# Patient Record
Sex: Female | Born: 1945 | Race: Black or African American | Hispanic: No | Marital: Single | Smoking: Never smoker
Health system: Southern US, Community
[De-identification: ages and names within clinical notes are randomized; demographics above are authoritative.]

## PROBLEM LIST (undated history)

## (undated) HISTORY — PX: SHOULDER SURGERY: SHX246

## (undated) HISTORY — PX: APPENDECTOMY: SHX54

---

## 2015-02-22 ENCOUNTER — Other Ambulatory Visit (HOSPITAL_COMMUNITY): Payer: Self-pay | Admitting: Orthopaedic Surgery

## 2015-02-22 DIAGNOSIS — R52 Pain, unspecified: Secondary | ICD-10-CM

## 2015-02-23 ENCOUNTER — Ambulatory Visit (HOSPITAL_COMMUNITY)
Admission: RE | Admit: 2015-02-23 | Discharge: 2015-02-23 | Disposition: A | Discharge status: Home / Self Care | Payer: Medicare Other | Source: Ambulatory Visit | Attending: Orthopaedic Surgery | Admitting: Orthopaedic Surgery

## 2015-02-23 DIAGNOSIS — M25511 Pain in right shoulder: Secondary | ICD-10-CM | POA: Insufficient documentation

## 2015-02-23 DIAGNOSIS — R52 Pain, unspecified: Secondary | ICD-10-CM

## 2016-04-29 IMAGING — MR MR SHOULDER*R* W/O CM
4 of 5 series · 19 of 40 positions shown · non-contrast
Comparison: None.

CLINICAL DATA: Right shoulder pain for 2 weeks.  No known injury.

EXAM:
MRI OF THE RIGHT SHOULDER WITHOUT CONTRAST
TECHNIQUE: Multiplanar, multisequence MR imaging of the shoulder was performed.
No intravenous contrast was administered.

[Series 3: T2 fat-sat · axial · 4.0mm · 0.23mm/px · z∈[-43,+40]mm · 6 of 22 slices shown (1 of 3)]
[im 1/22]
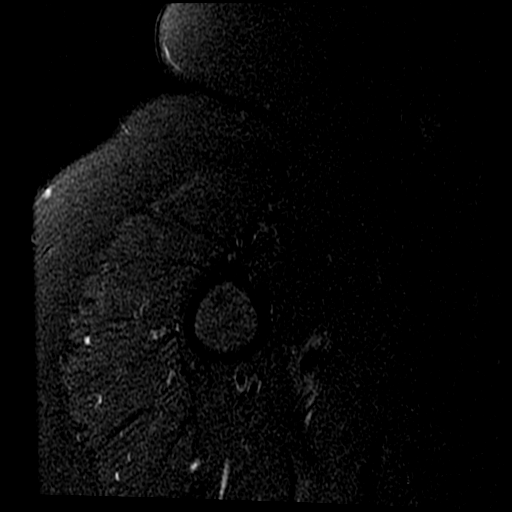
[im 4/22]
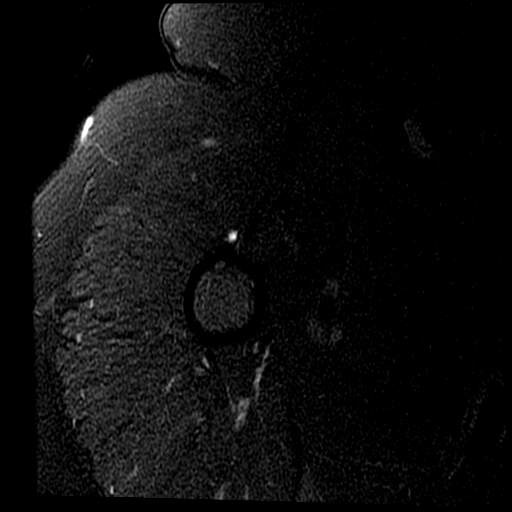
[im 7/22]
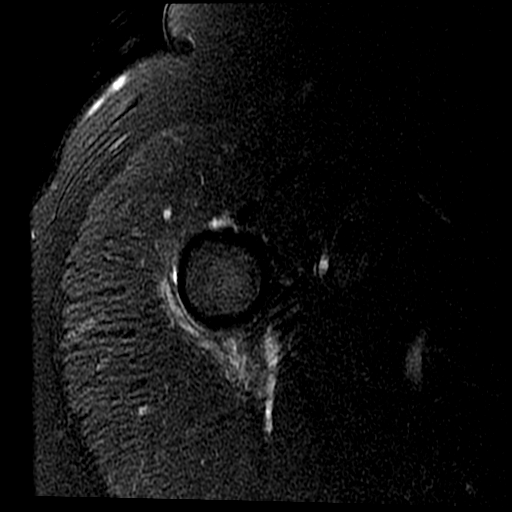
[im 10/22]
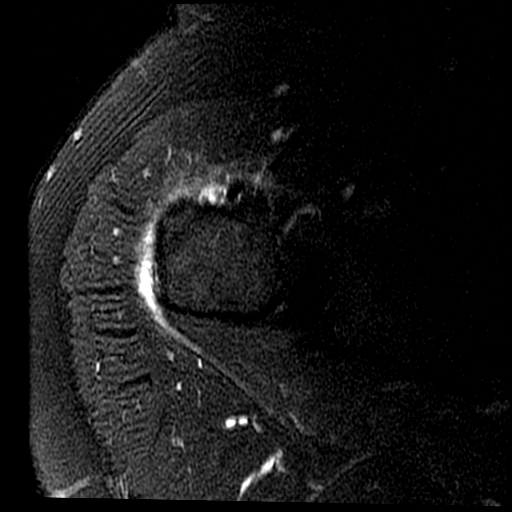
[im 13/22]
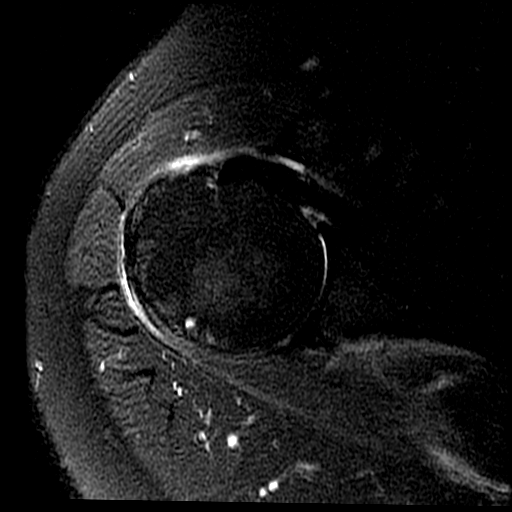
[im 19/22]
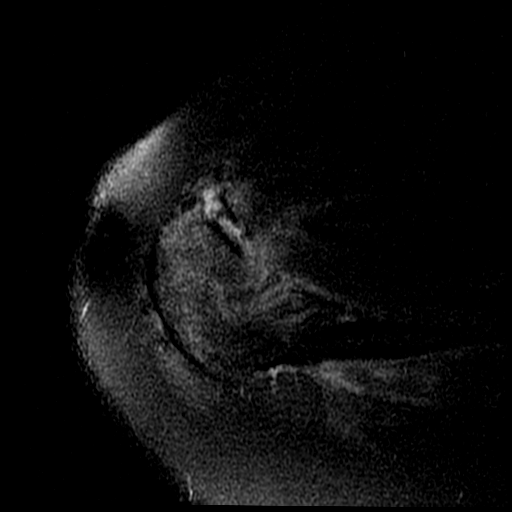

[Series 4: T2 fat-sat · oblique · 4.0mm · 0.29mm/px · 3 of 19 slices shown (2 of 3)]
[im 4/19]
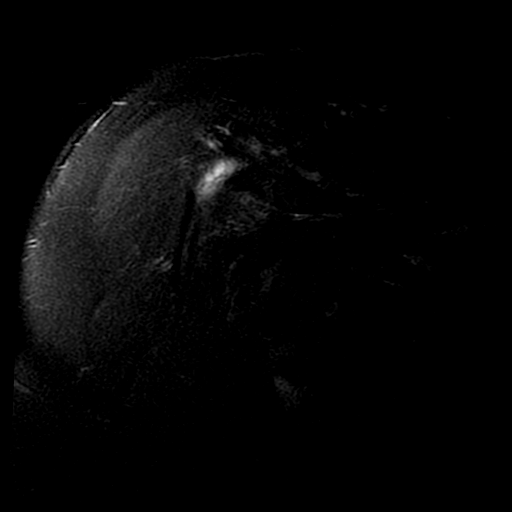
[im 10/19]
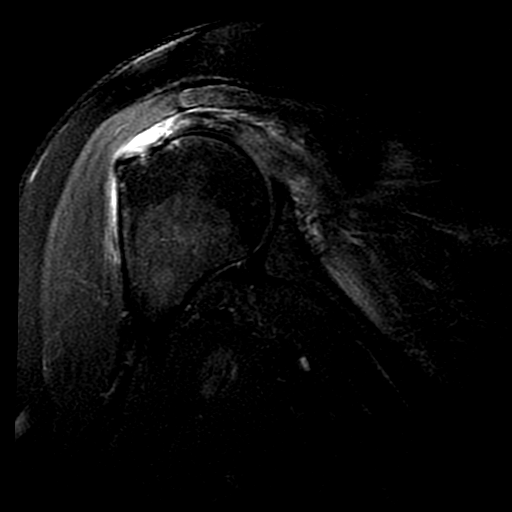
[im 16/19]
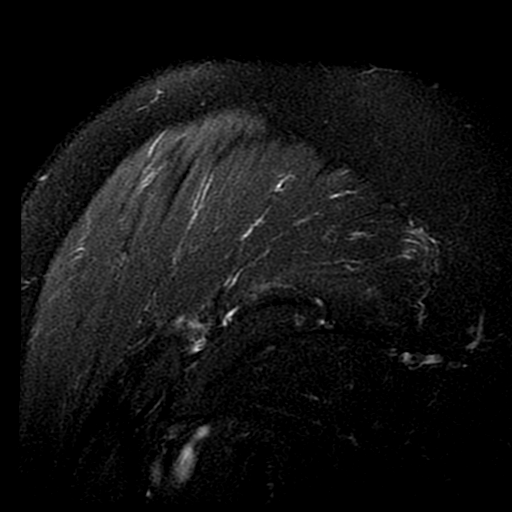

[Series 5: PD · oblique · 4.0mm · 0.29mm/px · 7 of 19 slices shown]
[im 1/19]
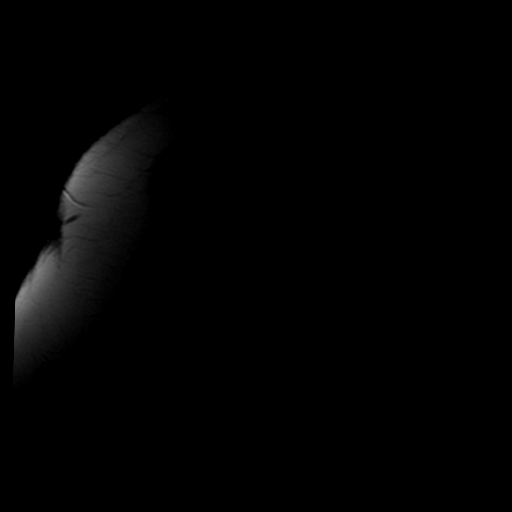
[im 4/19]
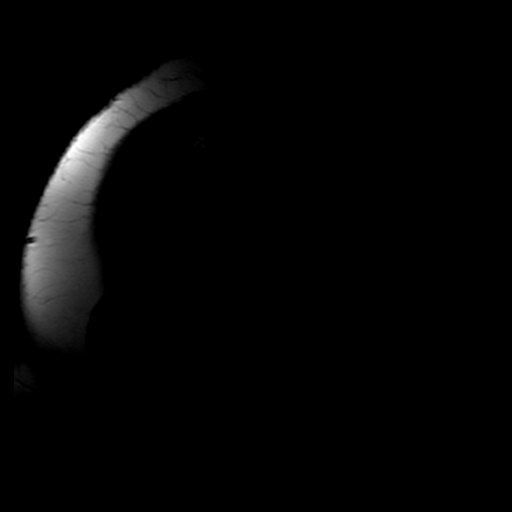
[im 7/19]
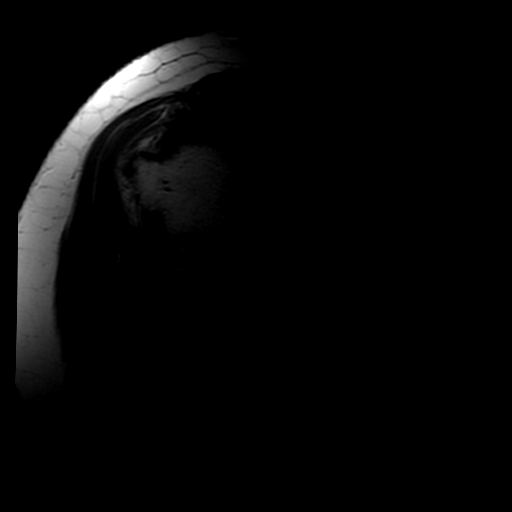
[im 10/19]
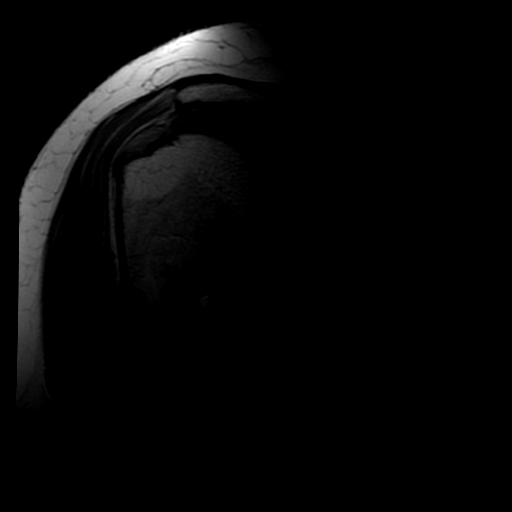
[im 13/19]
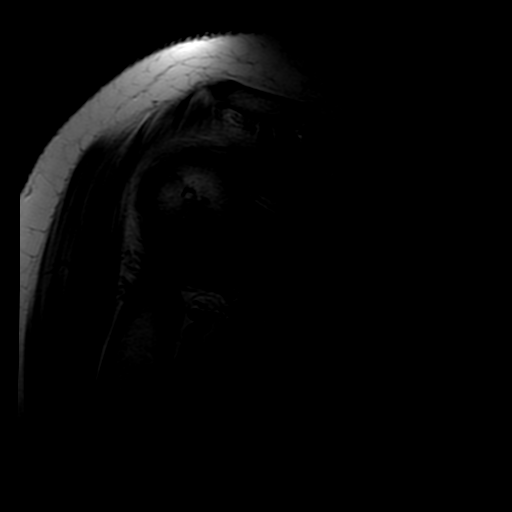
[im 16/19]
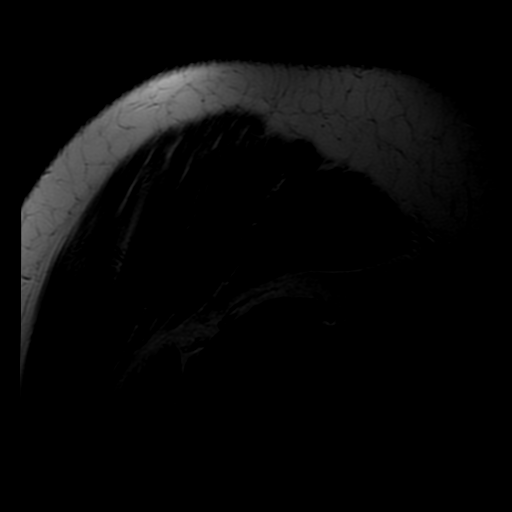
[im 19/19]
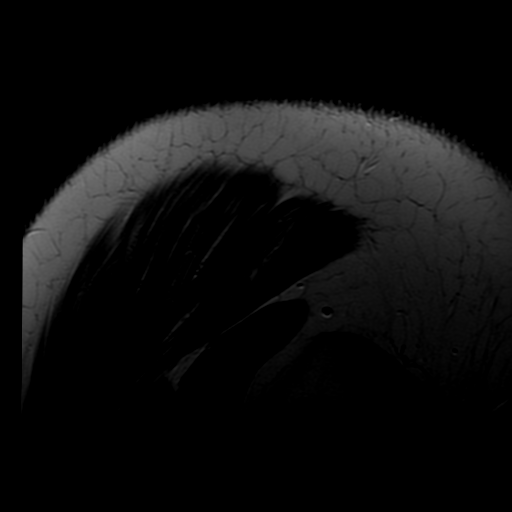

[Series 7: T2 fat-sat · oblique · 4.0mm · 0.29mm/px · 3 of 23 slices shown (3 of 3)]
[im 3/23]
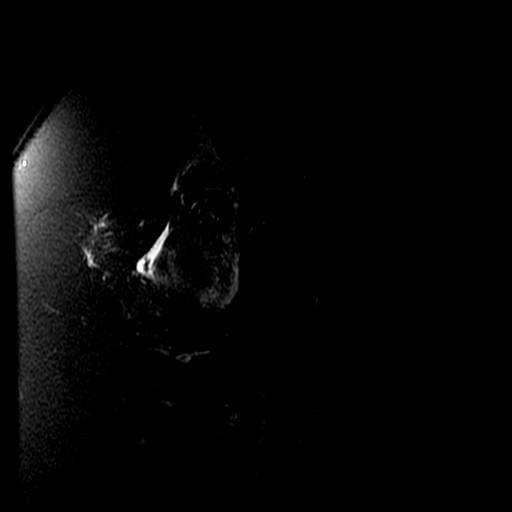
[im 12/23]
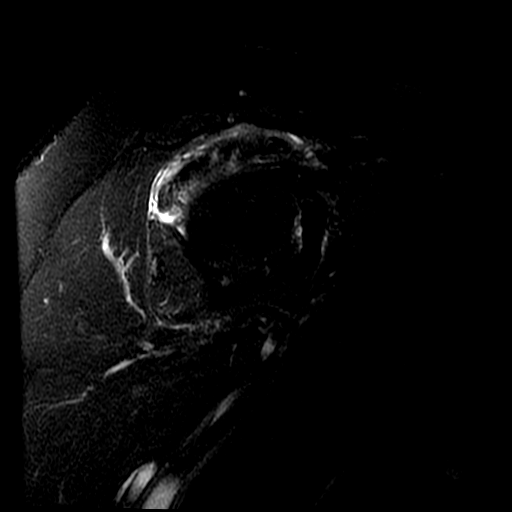
[im 20/23]
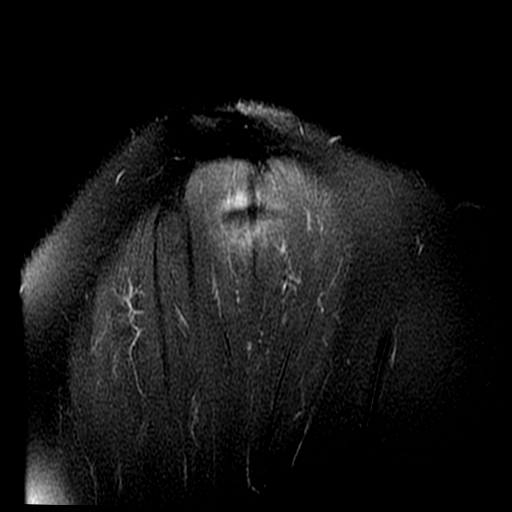

[19 of 40 positions shown; findings below may reference images not displayed]

FINDINGS: Rotator cuff: The patient has a near complete supraspinatus tendon
tear. Only a thin band of undersurface fibers may be intact.
Retraction is to the top of the humeral head, 2-3 cm. The
infraspinatus tendon appears completely torn and retracted 2-3 cm.
The subscapularis is intact.

Muscles: Mild infraspinatus atrophy is identified and there is edema
within the infraspinatus. Imaged musculature otherwise appears
normal.

Biceps long head:  Intact.

Acromioclavicular Joint:  Moderate degenerative change is seen.

Glenohumeral Joint: Unremarkable.

Labrum:  Intact.

Bones: The acromion is type 1. No fracture or worrisome marrow
lesion is identified. Fluid is present in the subacromial/subdeltoid
bursa.
IMPRESSION: Near complete supraspinatus tendon tear with 2-3 cm of retraction
and no atrophy.

The infraspinatus appears completely torn and retracted 2-3 cm. The
tear may be acute on chronic with both mild atrophy and edema seen
within the infraspinatus.

Moderate acromioclavicular degenerative change.

Subacromial/subdeltoid fluid consistent with bursitis.

## 2022-03-17 ENCOUNTER — Encounter: Payer: Self-pay | Admitting: Emergency Medicine

## 2022-03-17 ENCOUNTER — Emergency Department (INDEPENDENT_AMBULATORY_CARE_PROVIDER_SITE_OTHER): Payer: Medicare Other

## 2022-03-17 ENCOUNTER — Emergency Department
Admission: EM | Admit: 2022-03-17 | Discharge: 2022-03-17 | Disposition: A | Discharge status: Home / Self Care | Payer: Medicare Other | Source: Home / Self Care | Attending: Family Medicine | Admitting: Family Medicine

## 2022-03-17 DIAGNOSIS — S63511A Sprain of carpal joint of right wrist, initial encounter: Secondary | ICD-10-CM | POA: Diagnosis not present

## 2022-03-17 DIAGNOSIS — S01511A Laceration without foreign body of lip, initial encounter: Secondary | ICD-10-CM

## 2022-03-17 DIAGNOSIS — W010XXA Fall on same level from slipping, tripping and stumbling without subsequent striking against object, initial encounter: Secondary | ICD-10-CM

## 2022-03-17 DIAGNOSIS — M25531 Pain in right wrist: Secondary | ICD-10-CM

## 2022-03-17 MED ORDER — ACETAMINOPHEN 325 MG PO TABS
650.0000 mg | ORAL_TABLET | Freq: Once | ORAL | Status: AC
  Administered 2022-03-17: 650 mg via ORAL

## 2022-03-17 NOTE — ED Provider Notes (Signed)
[facility]    CSN: [id number] Arrival date & time: 03/17/22  1036      History   Chief Complaint Chief Complaint  Patient presents with   Fall    HPI Stephanie Bass is a 76 y.o. female.   Patient reports that she tripped on uneven pavement at 8:45am today while at a bus stop.  She fell forward, bracing herself with her hands, and striking her lip on pavement.  She experienced a laceration of her lower lip, minimal abrasion to her left temporal area, and mild swelling/pain in her right wrist. She bumped her left anterior knee but denies pain there.  She denies loss of consciousness, headache, dizziness, neck pain, etc.   The history is provided by the patient.   History reviewed. No pertinent past medical history.  There are no problems to display for this patient.   Past Surgical History:  Procedure Laterality Date   APPENDECTOMY     CESAREAN SECTION     SHOULDER SURGERY      OB History   No obstetric history on file.      Home Medications    Prior to Admission medications   Medication Sig Start Date End Date Taking? Authorizing Provider  amLODipine (NORVASC) 5 MG tablet amlodipine 5 mg tablet  TAKE 1 TABLET BY MOUTH DAILY 11/26/18  Yes [provider]  azelastine (ASTELIN) 0.1 % nasal spray ONE SPRAY BY NASAL ROUTE DAILY. USE IN EACH NOSTRIL AS DIRECTED 11/01/16  Yes [provider]  cetirizine (ZYRTEC) 10 MG tablet cetirizine 10 mg tablet  TAKE 1 TABLET BY MOUTH EVERY DAY 04/23/18  Yes [provider]  dorzolamide-timolol (COSOPT) 22.3-6.8 MG/ML ophthalmic solution dorzolamide 22.3 mg-timolol 6.8 mg/mL eye drops  INSTILL 1 DROP INTO BOTH EYES TWICE A DAY 11/24/19  Yes [provider]  ferrous sulfate 325 (65 FE) MG tablet FeroSul 325 mg (65 mg iron) tablet  TAKE 1 TABLET BY MOUTH TWICE DAILY 03/08/22  Yes [provider]  latanoprost (XALATAN) 0.005 % ophthalmic solution latanoprost 0.005 % eye drops  INSTILL 1  DROP INTO BOTH EYES AT BEDTIME 01/09/17  Yes [provider]  metoprolol tartrate (LOPRESSOR) 50 MG tablet metoprolol tartrate 50 mg tablet  TAKE 1 TABLET BY MOUTH TWICE A DAY 02/07/18  Yes [provider]  simvastatin (ZOCOR) 5 MG tablet every evening. 03/22/21  Yes [provider]  ezetimibe (ZETIA) 10 MG tablet Take 10 mg by mouth daily. 11/14/21   [provider]  lisinopril-hydrochlorothiazide (ZESTORETIC) 20-12.5 MG tablet Take 1 tablet by mouth 2 (two) times daily. 03/15/22   [provider]  Omega-3 Fatty Acids (FISH OIL) 1200 MG CAPS Take by mouth.    [provider]    Family History Family History  Problem Relation Age of Onset   Diabetes Father     Social History Social History   Tobacco Use   Smoking status: Never    Passive exposure: Never   Smokeless tobacco: Never  Vaping Use   Vaping Use: Never used  Substance Use Topics   Alcohol use: Not Currently   Drug use: Never     Allergies   Patient has no known allergies.   Review of Systems Review of Systems  Constitutional: Negative.   HENT:  Positive for facial swelling. Negative for trouble swallowing.        Laceration of lower lip  Eyes: Negative.   Respiratory: Negative.    Cardiovascular: Negative.   Gastrointestinal:  Negative.   Genitourinary: Negative.   Musculoskeletal:  Negative for neck pain.       Right wrist pain  Neurological:  Negative for dizziness, syncope and light-headedness.    Physical Exam Triage Vital Signs ED Triage Vitals  Enc Vitals Group     BP 03/17/22 1105 116/72     Pulse Rate 03/17/22 1105 70     Resp 03/17/22 1105 18     Temp 03/17/22 1105 97.8 F (36.6 C)     Temp Source 03/17/22 1105 Tympanic     SpO2 03/17/22 1105 99 %     Weight 03/17/22 1108 235 lb (106.6 kg)     Height 03/17/22 1108 5\' 3"  (1.6 m)     Head Circumference --      Peak Flow --      Pain Score 03/17/22 1107 5     Pain Loc --      Pain Edu? --       Excl. in GC? --    No data found.  Updated Vital Signs BP 116/72 (BP Location: Left Arm)   Pulse 70   Temp 97.8 F (36.6 C) (Tympanic) Comment: pt had ice on mouth on arrival  Resp 18   Ht 5\' 3"  (1.6 m)   Wt 106.6 kg   SpO2 99%   BMI 41.63 kg/m   Visual Acuity Right Eye Distance:   Left Eye Distance:   Bilateral Distance:    Right Eye Near:   Left Eye Near:    Bilateral Near:     Physical Exam Vitals and nursing note reviewed.  Constitutional:      General: She is not in acute distress.    Appearance: She is obese. She is not toxic-appearing.  HENT:     Head:      Comments: Left temporal area has a tiny 69mm abrasion.  No swelling present and minimal tenderness.    Right Ear: External ear normal.     Left Ear: External ear normal.     Nose: Nose normal.     Mouth/Throat:     Mouth: Mucous membranes are moist. No lacerations or oral lesions.     Dentition: Normal dentition. No dental tenderness, gingival swelling or gum lesions.     Tongue: No lesions.     Pharynx: Uvula midline.      Comments: Mid-lower lip has shallow Y-shaped one cm laceration on mucosal surface just inside vermilion border. Eyes:     Extraocular Movements: Extraocular movements intact.     Conjunctiva/sclera: Conjunctivae normal.     Pupils: Pupils are equal, round, and reactive to light.  Cardiovascular:     Rate and Rhythm: Normal rate and regular rhythm.     Heart sounds: Normal heart sounds.  Pulmonary:     Effort: Pulmonary effort is normal.     Breath sounds: Normal breath sounds.  Musculoskeletal:        General: Tenderness present.     Right wrist: Swelling and bony tenderness present. No deformity, lacerations, snuff box tenderness or crepitus. Decreased range of motion. Normal pulse.       Hands:     Cervical back: Normal range of motion. No tenderness.     Comments: Right wrist has point tenderness to palpation dorsally as noted on diagram.  Distal neurovascular function  is intact.     Skin:    General: Skin is warm and dry.  Neurological:     General: No focal deficit present.  Mental Status: She is alert and oriented to person, place, and time.     UC Treatments / Results  Labs (all labs ordered are listed, but only abnormal results are displayed) Labs Reviewed - No data to display  EKG   Radiology DG Wrist Complete Right  Result Date: 03/17/2022 CLINICAL DATA:  Fall.  Tenderness over the dorsal wrist. EXAM: RIGHT WRIST - COMPLETE 3+ VIEW COMPARISON:  None Available. FINDINGS: There is no evidence of fracture or dislocation. Mild arthritic changes of the first carpometacarpal joint. Soft tissues are unremarkable. IMPRESSION: No evidence of fracture or dislocation. Electronically Signed   By: Larose HiresImran  Ahmed D.O.   On: 03/17/2022 12:25    Procedures Procedures Laceration Repair lower lip. Discussed benefits and risks of procedure and verbal consent obtained. Using sterile technique and local anesthesia with 2% lidocaine with epinephrine, cleansed wound with saline lavage.  Wound carefully inspected for debris and foreign bodies; none found.  Wound closed with one 5-0 Vicryl suture. Wound precautions explained to patient.     Medications Ordered in UC Medications  acetaminophen (TYLENOL) tablet 650 mg (650 mg Oral Given 03/17/22 1115)    Initial Impression / Assessment and Plan / UC Course  I have reviewed the triage vital signs and the nursing notes.  Pertinent labs & imaging results that were available during my care of the patient were reviewed by me and considered in my medical decision making (see chart for details).    Ace wrap applied to right wrist.  Given sprain treatment instructions with range of motion and stretching exercises.  Followup with Family Doctor if not improved in about 5 days.  Final Clinical Impressions(s) / UC Diagnoses   Final diagnoses:  Laceration of lower lip, initial encounter  Sprain of intercarpal joint  of right wrist, initial encounter     Discharge Instructions      Apply ice pack for to lower lip for 10 to 15 minutes, 3 to 4 times daily  Continue until pain and swelling decrease.  May take Tylenol as needed for pain. Wear ace wrap on right wrist until swelling resolves.  Begin range of motion and stretching exercises as tolerated.  Stitch in lower lip should come out by itself as healing occurs (may followup with family doctor if not out in one week).     ED Prescriptions   None       Lattie HawBeese, Julaine Zimny A, MD 03/17/22 1743

## 2022-03-17 NOTE — Discharge Instructions (Signed)
Apply ice pack for to lower lip for 10 to 15 minutes, 3 to 4 times daily  Continue until pain and swelling decrease.  May take Tylenol as needed for pain. Wear ace wrap on right wrist until swelling resolves.  Begin range of motion and stretching exercises as tolerated.  Stitch in lower lip should come out by itself as healing occurs (may followup with family doctor if not out in one week).

## 2022-03-17 NOTE — ED Triage Notes (Signed)
Larey Seat this morning at 0845 walking back from the school bus  Lip laceration inside of lower lip - not actively bleeding Small abrasion to left knee & left corner of eye from glasses Pain to right wrist w/ ROM  (Xray refused at this time)  Pt denies LOC  Tylenol & Ice  in triage  Here w/ a family friend

## 2023-05-22 IMAGING — DX DG WRIST COMPLETE 3+V*R*
4 series · 4 of 4 positions shown · non-contrast
Comparison: None Available.

CLINICAL DATA: Fall.  Tenderness over the dorsal wrist.

EXAM:
RIGHT WRIST - COMPLETE 3+ VIEW

[wrist pa]
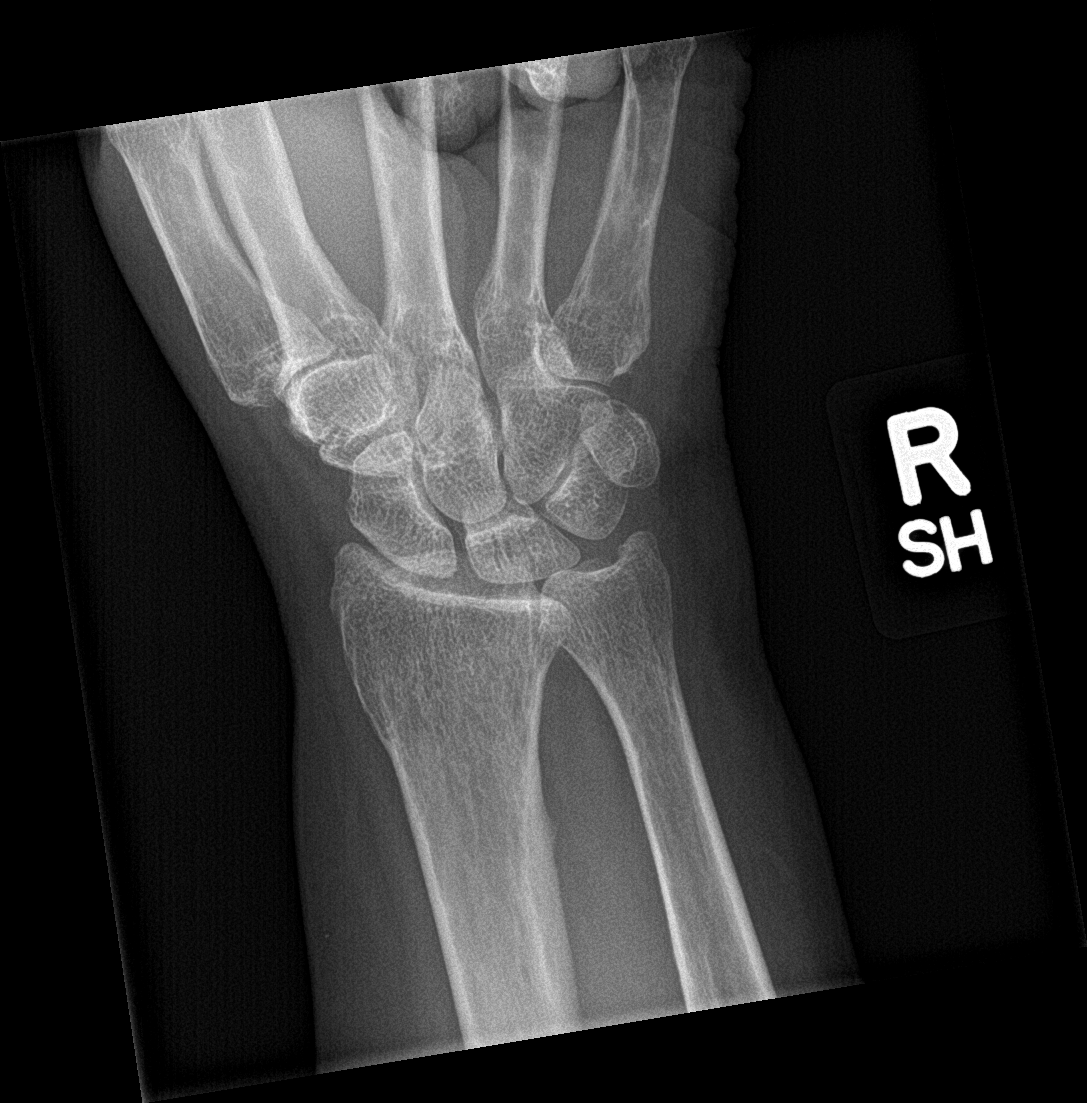

[wrist obl]
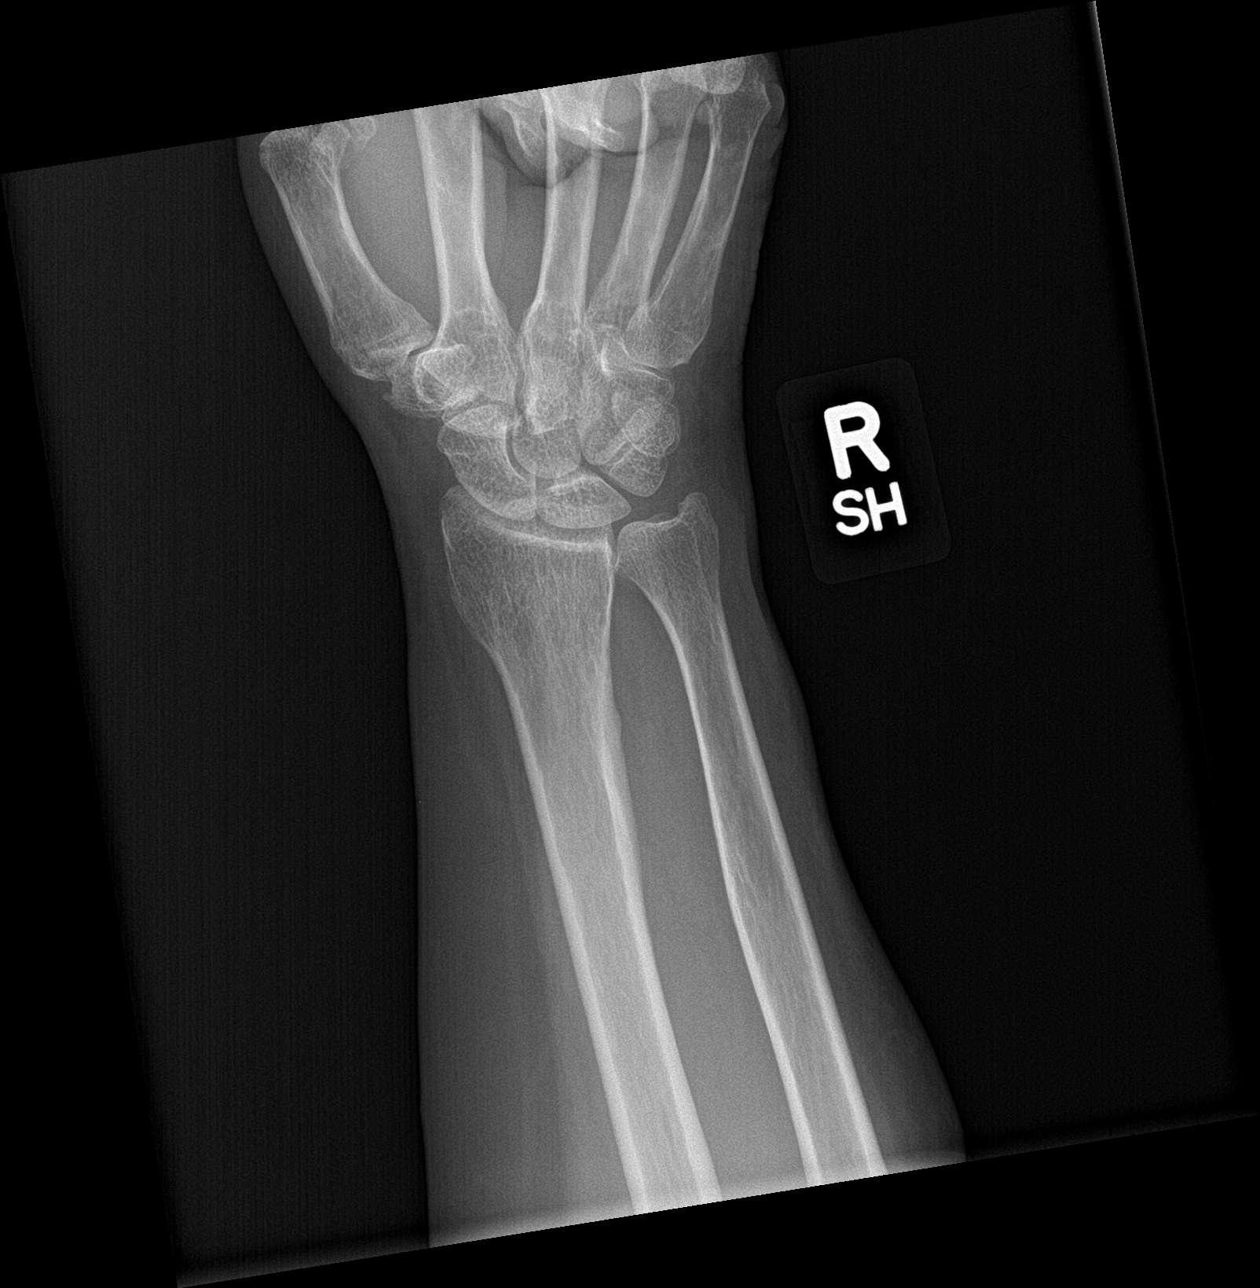

[wrist navicular]
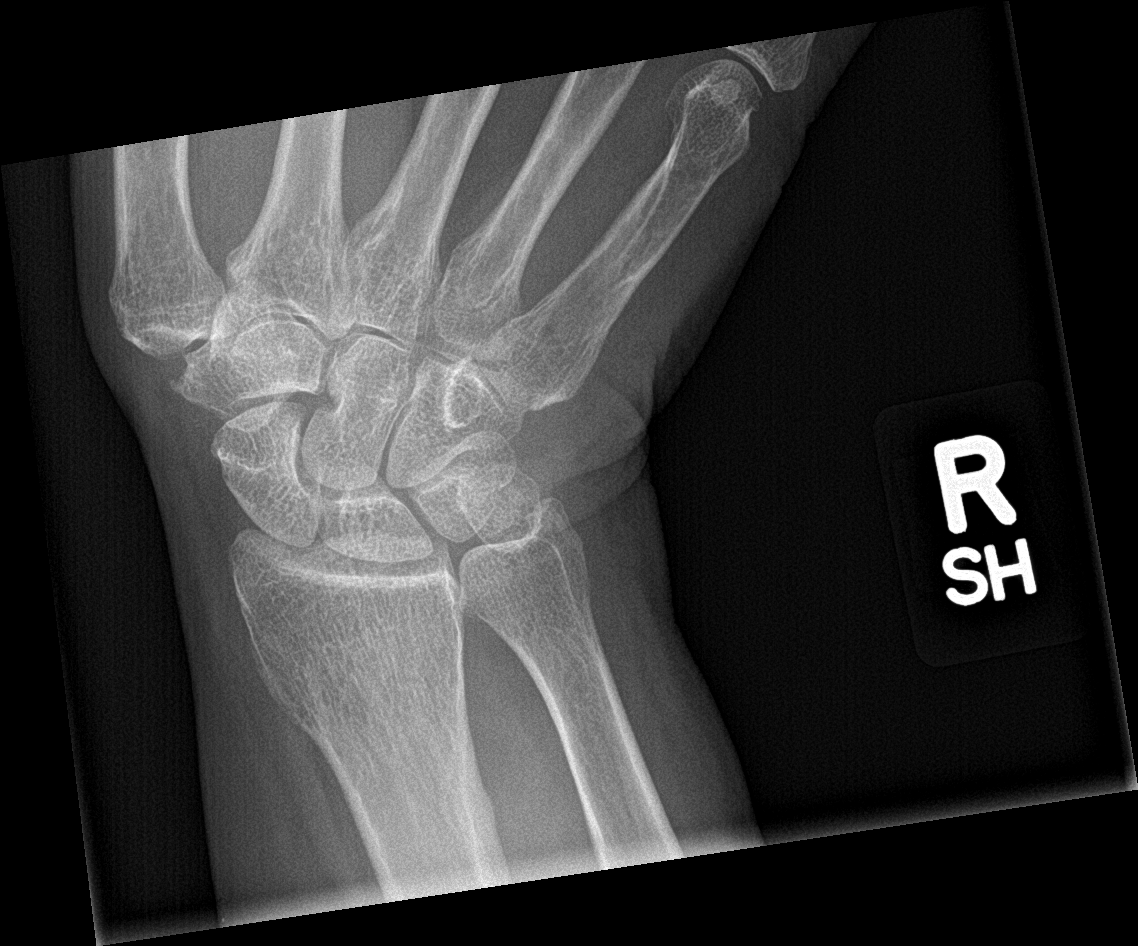

[wrist lat]
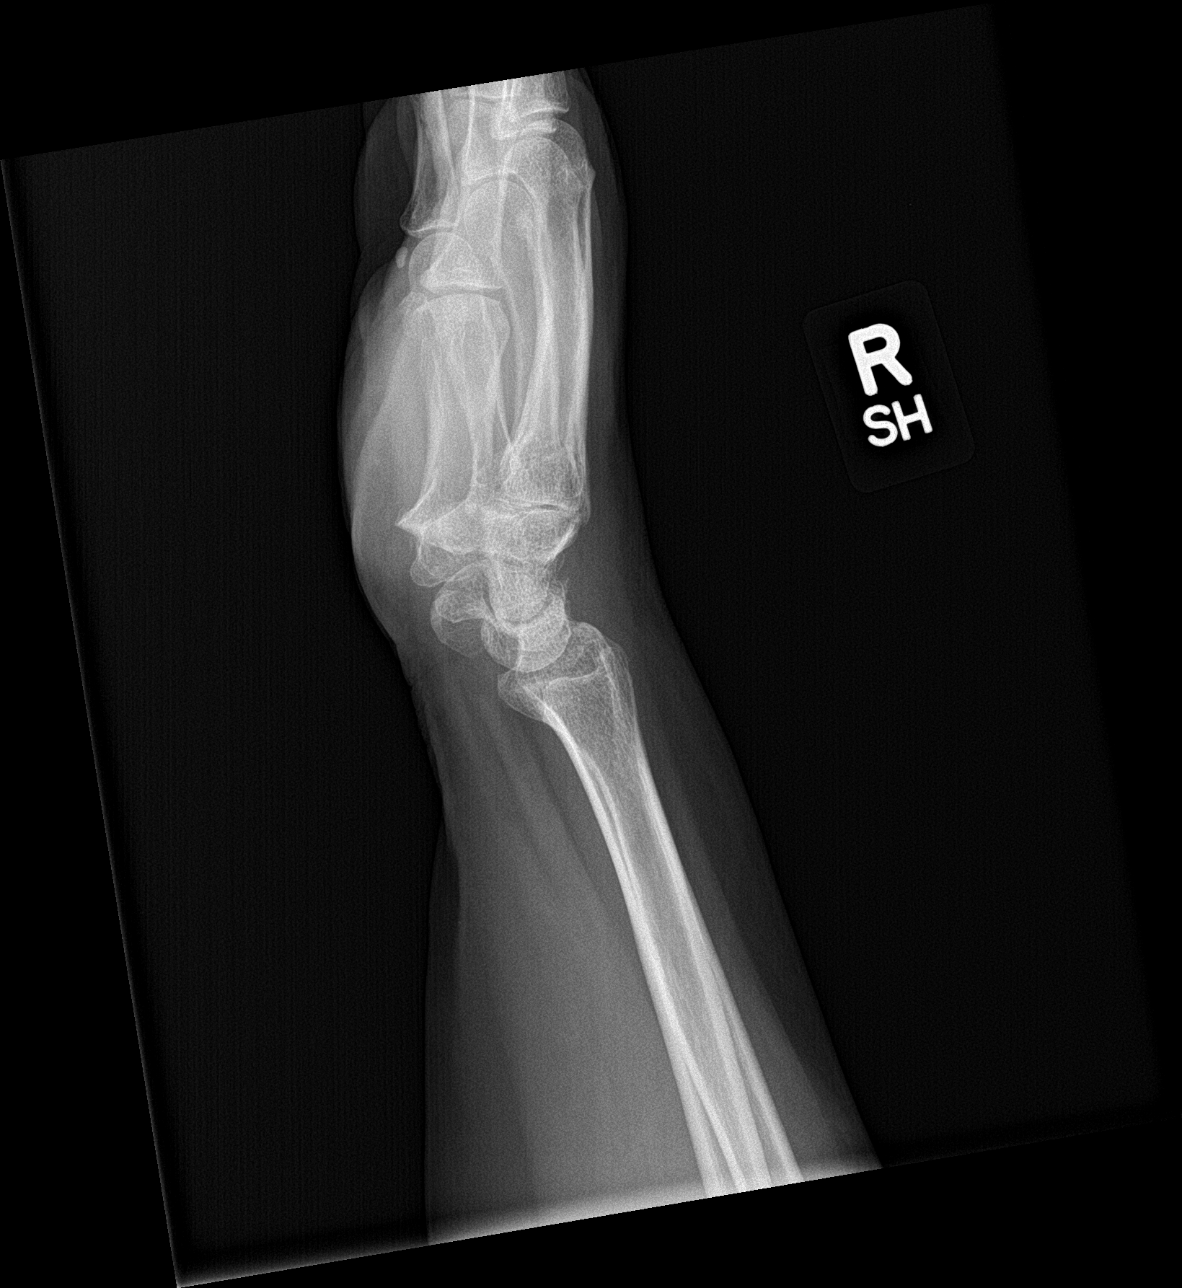

[4 of 4 positions shown; findings below may reference images not displayed]

FINDINGS: There is no evidence of fracture or dislocation. Mild arthritic
changes of the first carpometacarpal joint. Soft tissues are
unremarkable.
IMPRESSION: No evidence of fracture or dislocation.
